# Patient Record
Sex: Female | Born: 1950 | Race: White | Hispanic: No | Marital: Married | Smoking: Former smoker
Health system: Southern US, Community
[De-identification: ages and names within clinical notes are randomized; demographics above are authoritative.]

## PROBLEM LIST (undated history)

## (undated) DIAGNOSIS — Z9889 Other specified postprocedural states: Secondary | ICD-10-CM

## (undated) DIAGNOSIS — M1990 Unspecified osteoarthritis, unspecified site: Secondary | ICD-10-CM

## (undated) DIAGNOSIS — C801 Malignant (primary) neoplasm, unspecified: Secondary | ICD-10-CM

## (undated) DIAGNOSIS — K219 Gastro-esophageal reflux disease without esophagitis: Secondary | ICD-10-CM

## (undated) HISTORY — PX: TONSILLECTOMY: SUR1361

---

## 1996-03-10 HISTORY — PX: HAND SURGERY: SHX662

## 2008-08-14 ENCOUNTER — Ambulatory Visit: Payer: Self-pay | Admitting: Family Medicine

## 2010-02-05 ENCOUNTER — Ambulatory Visit: Payer: Self-pay | Admitting: Family Medicine

## 2014-05-03 ENCOUNTER — Ambulatory Visit: Payer: Self-pay | Admitting: Gastroenterology

## 2014-06-28 ENCOUNTER — Ambulatory Visit
Admit: 2014-06-28 | Disposition: A | Discharge status: Home / Self Care | Payer: Self-pay | Attending: Family Medicine | Admitting: Family Medicine

## 2015-07-16 ENCOUNTER — Ambulatory Visit
Admission: EM | Admit: 2015-07-16 | Discharge: 2015-07-16 | Disposition: A | Discharge status: Home / Self Care | Payer: BLUE CROSS/BLUE SHIELD | Attending: Family Medicine | Admitting: Family Medicine

## 2015-07-16 ENCOUNTER — Encounter: Payer: Self-pay | Admitting: Gynecology

## 2015-07-16 DIAGNOSIS — Z1833 Retained wood fragments: Secondary | ICD-10-CM | POA: Diagnosis not present

## 2015-07-16 HISTORY — DX: Unspecified osteoarthritis, unspecified site: M19.90

## 2015-07-16 HISTORY — DX: Gastro-esophageal reflux disease without esophagitis: K21.9

## 2015-07-16 HISTORY — DX: Other specified postprocedural states: Z98.890

## 2015-07-16 HISTORY — DX: Malignant (primary) neoplasm, unspecified: C80.1

## 2015-07-16 NOTE — ED Provider Notes (Signed)
CSN: [id number]     Arrival date & time 07/16/15  1711 History   First MD Initiated Contact with Patient 07/16/15 1831     Chief Complaint  Patient presents with  . Foreign Body in Skin   (Consider location/radiation/quality/duration/timing/severity/associated sxs/prior Treatment) HPI Comments: 66 yo female with a c/o painful splinter underneath right index fingernail embedded while moving mulch today. States she tried to remove with some tweezers at home but unsuccessful. Patient is otherwise healthy.   The history is provided by the patient.    Past Medical History  Diagnosis Date  . GERD (gastroesophageal reflux disease)   . Arthritis   . Cancer ([place])     skin  . Hx of hand surgery     left hand   Past Surgical History  Procedure Laterality Date  . Tonsillectomy     No family history on file. Social History  Substance Use Topics  . Smoking status: Never Smoker   . Smokeless tobacco: None  . Alcohol Use: Yes   OB History    No data available     Review of Systems  Allergies  Review of patient's allergies indicates no known allergies.  Home Medications   Prior to Admission medications   Not on File   Meds Ordered and Administered this Visit  Medications - No data to display  BP 138/62 mmHg  Pulse 73  Temp(Src) 98.2 F (36.8 C) (Oral)  Resp 16  Ht 5\' 3"  (1.6 m)  Wt 165 lb (74.844 kg)  BMI 29.24 kg/m2  SpO2 98% No data found.   Physical Exam  Constitutional: She appears well-developed and well-nourished. No distress.  Musculoskeletal:       Hands: Right index finger neurovascularly intact with normal range of motion; approximately 4-17mm straight brown color foreign body embedded underneath the fingernail with distal piece slightly extending just under the distal part of the nail  Skin: She is not diaphoretic.  Nursing note and vitals reviewed.   ED Course  Procedures (including critical care time)  Labs Review Labs Reviewed - No data to  display  Imaging Review No results found.   Visual Acuity Review  Right Eye Distance:   Left Eye Distance:   Bilateral Distance:    Right Eye Near:   Left Eye Near:    Bilateral Near:         MDM   1. Embedded wood splinter   (to right index finger)  1. diagnosis reviewed with patient; wood splinter removed in its entirety with splinter forceps; patient tolerated procedure well; finger soaked, irrigated and bandaged. Patient is up to date on her tetanus vaccine. Wound care instructions given. Monitor for signs of infection and follow up as needed.  Norval Gable, MD 07/16/15 1924

## 2015-07-16 NOTE — ED Notes (Signed)
Patient stated x today while moving mulch from wooden fench splinter from wood caught on right index finger

## 2016-12-02 ENCOUNTER — Other Ambulatory Visit: Payer: Self-pay | Admitting: Family Medicine

## 2016-12-02 DIAGNOSIS — R109 Unspecified abdominal pain: Secondary | ICD-10-CM

## 2016-12-02 DIAGNOSIS — R3129 Other microscopic hematuria: Secondary | ICD-10-CM

## 2016-12-15 ENCOUNTER — Ambulatory Visit: Payer: BLUE CROSS/BLUE SHIELD

## 2017-05-28 ENCOUNTER — Other Ambulatory Visit: Payer: Self-pay | Admitting: Family Medicine

## 2017-05-28 DIAGNOSIS — M8580 Other specified disorders of bone density and structure, unspecified site: Secondary | ICD-10-CM

## 2017-07-22 ENCOUNTER — Other Ambulatory Visit: Payer: Self-pay | Admitting: Family Medicine

## 2017-07-22 DIAGNOSIS — Z1231 Encounter for screening mammogram for malignant neoplasm of breast: Secondary | ICD-10-CM

## 2017-07-27 ENCOUNTER — Ambulatory Visit
Admission: RE | Admit: 2017-07-27 | Discharge: 2017-07-27 | Disposition: A | Discharge status: Home / Self Care | Payer: BLUE CROSS/BLUE SHIELD | Source: Ambulatory Visit | Attending: Family Medicine | Admitting: Family Medicine

## 2017-07-27 ENCOUNTER — Encounter (INDEPENDENT_AMBULATORY_CARE_PROVIDER_SITE_OTHER): Payer: Self-pay

## 2017-07-27 DIAGNOSIS — M8580 Other specified disorders of bone density and structure, unspecified site: Secondary | ICD-10-CM | POA: Insufficient documentation

## 2017-07-27 DIAGNOSIS — Z1231 Encounter for screening mammogram for malignant neoplasm of breast: Secondary | ICD-10-CM | POA: Insufficient documentation

## 2018-07-28 ENCOUNTER — Other Ambulatory Visit: Payer: Self-pay | Admitting: Gastroenterology

## 2018-07-28 DIAGNOSIS — R1084 Generalized abdominal pain: Secondary | ICD-10-CM

## 2018-08-05 ENCOUNTER — Other Ambulatory Visit: Payer: Self-pay

## 2018-08-05 ENCOUNTER — Ambulatory Visit
Admission: RE | Admit: 2018-08-05 | Discharge: 2018-08-05 | Disposition: A | Discharge status: Home / Self Care | Payer: Medicare Other | Source: Ambulatory Visit | Attending: Gastroenterology | Admitting: Gastroenterology

## 2018-08-05 DIAGNOSIS — R1084 Generalized abdominal pain: Secondary | ICD-10-CM | POA: Diagnosis present

## 2018-08-05 MED ORDER — IOHEXOL 300 MG/ML  SOLN
100.0000 mL | Freq: Once | INTRAMUSCULAR | Status: AC | PRN
  Administered 2018-08-05: 10:00:00 100 mL via INTRAVENOUS

## 2018-08-10 ENCOUNTER — Other Ambulatory Visit
Admission: RE | Admit: 2018-08-10 | Discharge: 2018-08-10 | Disposition: A | Discharge status: Home / Self Care | Payer: Medicare Other | Source: Ambulatory Visit | Attending: Gastroenterology | Admitting: Gastroenterology

## 2018-08-10 ENCOUNTER — Other Ambulatory Visit: Payer: Self-pay

## 2018-08-10 DIAGNOSIS — Z1159 Encounter for screening for other viral diseases: Secondary | ICD-10-CM | POA: Diagnosis present

## 2018-08-11 LAB — NOVEL CORONAVIRUS, NAA (HOSP ORDER, SEND-OUT TO REF LAB; TAT 18-24 HRS): SARS-CoV-2, NAA: NOT DETECTED

## 2018-08-13 ENCOUNTER — Ambulatory Visit: Payer: Medicare Other | Admitting: Certified Registered Nurse Anesthetist

## 2018-08-13 ENCOUNTER — Encounter: Payer: Self-pay | Admitting: *Deleted

## 2018-08-13 ENCOUNTER — Encounter
Admission: RE | Disposition: A | Discharge status: Home / Self Care | Payer: Self-pay | Source: Home / Self Care | Attending: Gastroenterology

## 2018-08-13 ENCOUNTER — Ambulatory Visit
Admission: RE | Admit: 2018-08-13 | Discharge: 2018-08-13 | Disposition: A | Discharge status: Home / Self Care | Payer: Medicare Other | Attending: Gastroenterology | Admitting: Gastroenterology

## 2018-08-13 ENCOUNTER — Other Ambulatory Visit: Payer: Self-pay

## 2018-08-13 DIAGNOSIS — K219 Gastro-esophageal reflux disease without esophagitis: Secondary | ICD-10-CM | POA: Diagnosis present

## 2018-08-13 DIAGNOSIS — K2970 Gastritis, unspecified, without bleeding: Secondary | ICD-10-CM | POA: Insufficient documentation

## 2018-08-13 DIAGNOSIS — Z6830 Body mass index (BMI) 30.0-30.9, adult: Secondary | ICD-10-CM | POA: Insufficient documentation

## 2018-08-13 DIAGNOSIS — E669 Obesity, unspecified: Secondary | ICD-10-CM | POA: Insufficient documentation

## 2018-08-13 DIAGNOSIS — M1990 Unspecified osteoarthritis, unspecified site: Secondary | ICD-10-CM | POA: Insufficient documentation

## 2018-08-13 HISTORY — PX: ESOPHAGOGASTRODUODENOSCOPY (EGD) WITH PROPOFOL: SHX5813

## 2018-08-13 SURGERY — ESOPHAGOGASTRODUODENOSCOPY (EGD) WITH PROPOFOL
Anesthesia: General

## 2018-08-13 MED ORDER — SODIUM CHLORIDE 0.9 % IV SOLN
INTRAVENOUS | Status: DC
  Administered 2018-08-13: 13:00:00 via INTRAVENOUS

## 2018-08-13 MED ORDER — PROPOFOL 10 MG/ML IV BOLUS
INTRAVENOUS | Status: DC | PRN
  Administered 2018-08-13: 40 mg via INTRAVENOUS

## 2018-08-13 MED ORDER — LIDOCAINE HCL (PF) 1 % IJ SOLN
INTRAMUSCULAR | Status: AC
  Filled 2018-08-13: qty 2

## 2018-08-13 MED ORDER — PROPOFOL 500 MG/50ML IV EMUL
INTRAVENOUS | Status: DC | PRN
  Administered 2018-08-13: 100 ug/kg/min via INTRAVENOUS

## 2018-08-13 MED ORDER — LIDOCAINE HCL (CARDIAC) PF 100 MG/5ML IV SOSY
PREFILLED_SYRINGE | INTRAVENOUS | Status: DC | PRN
  Administered 2018-08-13: 50 mg via INTRAVENOUS

## 2018-08-13 MED ORDER — SODIUM CHLORIDE 0.9 % IV SOLN
INTRAVENOUS | Status: DC
  Administered 2018-08-13: 1000 mL via INTRAVENOUS

## 2018-08-13 NOTE — Anesthesia Postprocedure Evaluation (Signed)
Anesthesia Post Note  Patient: Gloria Gaines [facility]  Procedure(s) Performed: ESOPHAGOGASTRODUODENOSCOPY (EGD) WITH PROPOFOL (N/A )  Patient location during evaluation: PACU Anesthesia Type: General Level of consciousness: awake and alert and oriented Pain management: pain level controlled Vital Signs Assessment: post-procedure vital signs reviewed and stable Respiratory status: spontaneous breathing, nonlabored ventilation and respiratory function stable Cardiovascular status: blood pressure returned to baseline and stable Postop Assessment: no signs of nausea or vomiting Anesthetic complications: no     Last Vitals:  Vitals:   08/13/18 1338 08/13/18 1348  BP: (!) 118/58 128/69  Pulse: 73 69  Resp: 20 19  Temp: (!) 36.1 C   SpO2:      Last Pain:  Vitals:   08/13/18 1348  TempSrc:   PainSc: 0-No pain                 Deatra Mcmahen

## 2018-08-13 NOTE — H&P (Signed)
Outpatient short stay form Pre-procedure 08/13/2018 1:15 PM Lollie Sails MD  Primary Physician: Gayland Curry, MD  Reason for visit: EGD  History of present illness: Patient is a 68 year old female presenting today for an EGD.  Has a history of some dyspepsia and weight loss.  She was having some epigastric pain and nausea.  She was started on a proton pump inhibitor, Protonix and Carafate.  Says improved all of her symptoms.  She has had no dysphagia although previously did have some heartburn symptoms.  She did have a CT scan because of the abdominal pain that was found to have a thickened region at the bottom of the esophagus.  Presented today for an EGD.  Was taking ibuprofen regularly before her symptoms developed and has subsequently stopped that.  She was taking it right before going to bed and is possible that she had some pill esophagitis as well. She takes no other agents such as aspirin or blood thinning agents.  Further NSAID use.   Current Facility-Administered Medications:  .  0.9 %  sodium chloride infusion, , Intravenous, Continuous, Lollie Sails, MD .  0.9 %  sodium chloride infusion, , Intravenous, Continuous, Lollie Sails, MD, Last Rate: 20 mL/hr at 08/13/18 1308, 1,000 mL at 08/13/18 1308 .  lidocaine (PF) (XYLOCAINE) 1 % injection, , , ,   Medications Prior to Admission  Medication Sig Dispense Refill Last Dose  . pantoprazole (PROTONIX) 20 MG tablet Take 20 mg by mouth daily.   08/12/2018 at Unknown time  . sucralfate (CARAFATE) 1 g tablet Take 1 g by mouth 4 (four) times daily -  with meals and at bedtime.   08/12/2018 at Unknown time  . dicyclomine (BENTYL) 10 MG capsule Take 10 mg by mouth 4 (four) times daily -  before meals and at bedtime.   Not Taking at Unknown time  . ondansetron (ZOFRAN) 4 MG tablet Take 4 mg by mouth every 8 (eight) hours as needed for nausea or vomiting.   Not Taking at Unknown time     No Known Allergies   Past Medical  History:  Diagnosis Date  . Arthritis   . Cancer ([place])    skin  . GERD (gastroesophageal reflux disease)   . Hx of hand surgery    left hand    Review of systems:      Physical Exam    Heart and lungs: Rhythm without rub or gallop lungs are bilaterally clear    HEENT: Normocephalic atraumatic eyes are anicteric    Other:    Pertinant exam for procedure: Soft nontender nondistended bowel sounds positive normoactive    Planned proceedures: EGD and indicated procedures. I have discussed the risks benefits and complications of procedures to include not limited to bleeding, infection, perforation and the risk of sedation and the patient wishes to proceed.    Lollie Sails, MD Gastroenterology 08/13/2018  1:15 PM

## 2018-08-13 NOTE — Op Note (Signed)
[facility] Gastroenterology Patient Name: Gloria Gaines Procedure Date: 08/13/2018 12:41 PM MRN: [id number] Account #: [id number] Date of Birth: Aug 23, 1950 Admit Type: Outpatient Age: 68 Room: [facility] ENDO ROOM 4 Gender: Female Note Status: Finalized Procedure:            Upper GI endoscopy Indications:          Dyspepsia, Abnormal CT of the GI tract Providers:            Lollie Sails, MD Referring MD:         Gayland Curry MD, MD (Referring MD) Medicines:            Monitored Anesthesia Care Complications:        No immediate complications. Procedure:            Pre-Anesthesia Assessment:                       - ASA Grade Assessment: II - A patient with mild                        systemic disease.                       After obtaining informed consent, the endoscope was                        passed under direct vision. Throughout the procedure,                        the patient's blood pressure, pulse, and oxygen                        saturations were monitored continuously. The Endoscope                        was introduced through the mouth, and advanced to the                        third part of duodenum. The upper GI endoscopy was                        accomplished without difficulty. The patient tolerated                        the procedure well. Findings:      The Z-line was variable. Biopsies were taken with a cold forceps for       histology.      The distal esophagus is otherwise normal.      Diffuse and patchy minimal inflammation characterized by congestion       (edema) was found in the gastric body. Biopsies were taken with a cold       forceps for histology. Biopsies were taken with a cold forceps for       histology.      The examined duodenum was normal.      The cardia and gastric fundus were normal on retroflexion.      The aretynoid prominances are noted to be mildly erythematous. Impression:           - Z-line variable.  Biopsied.                       -  Gastritis. Biopsied.                       - Normal examined duodenum. Recommendation:       - Discharge patient to home.                       - Use Protonix (pantoprazole) 40 mg PO daily daily. Procedure Code(s):    --- Professional ---                       [phone], Esophagogastroduodenoscopy, flexible, transoral;                        with biopsy, single or multiple Diagnosis Code(s):    --- Professional ---                       K22.8, Other specified diseases of esophagus                       K29.70, Gastritis, unspecified, without bleeding                       R10.13, Epigastric pain                       R93.3, Abnormal findings on diagnostic imaging of other                        parts of digestive tract CPT copyright 2019 American Medical Association. All rights reserved. The codes documented in this report are preliminary and upon coder review may  be revised to meet current compliance requirements. Lollie Sails, MD 08/13/2018 1:41:14 PM This report has been signed electronically. Number of Addenda: 0 Note Initiated On: 08/13/2018 12:41 PM      [facility]

## 2018-08-13 NOTE — Transfer of Care (Signed)
Immediate Anesthesia Transfer of Care Note  Patient: Gloria Gaines [facility]  Procedure(s) Performed: ESOPHAGOGASTRODUODENOSCOPY (EGD) WITH PROPOFOL (N/A )  Patient Location: PACU  Anesthesia Type:MAC  Level of Consciousness: awake, alert  and oriented  Airway & Oxygen Therapy: Patient Spontanous Breathing  Post-op Assessment: Report given to RN and Post -op Vital signs reviewed and stable  Post vital signs: Reviewed and stable  Last Vitals:  Vitals Value Taken Time  BP    Temp    Pulse    Resp    SpO2      Last Pain:  Vitals:   08/13/18 1231  TempSrc: Tympanic  PainSc: 0-No pain         Complications: No apparent anesthesia complications

## 2018-08-13 NOTE — Anesthesia Preprocedure Evaluation (Signed)
Anesthesia Evaluation  Patient identified by MRN, date of birth, ID band Patient awake    Reviewed: Allergy & Precautions, NPO status , Patient's Chart, lab work & pertinent test results  History of Anesthesia Complications Negative for: history of anesthetic complications  Airway Mallampati: II  TM Distance: >3 FB Neck ROM: Full    Dental no notable dental hx.    Pulmonary neg pulmonary ROS, neg sleep apnea, neg COPD,    breath sounds clear to auscultation- rhonchi (-) wheezing      Cardiovascular Exercise Tolerance: Good (-) hypertension(-) CAD, (-) Past MI, (-) Cardiac Stents and (-) CABG  Rhythm:Regular Rate:Normal - Systolic murmurs and - Diastolic murmurs    Neuro/Psych neg Seizures negative neurological ROS  negative psych ROS   GI/Hepatic Neg liver ROS, GERD  ,  Endo/Other  negative endocrine ROSneg diabetes  Renal/GU negative Renal ROS     Musculoskeletal  (+) Arthritis ,   Abdominal (+) + obese,   Peds  Hematology negative hematology ROS (+)   Anesthesia Other Findings Past Medical History: No date: Arthritis No date: Cancer ([place])     Comment:  skin No date: GERD (gastroesophageal reflux disease) No date: Hx of hand surgery     Comment:  left hand   Reproductive/Obstetrics                             Anesthesia Physical Anesthesia Plan  ASA: II  Anesthesia Plan: General   Post-op Pain Management:    Induction: Intravenous  PONV Risk Score and Plan: 2 and Propofol infusion  Airway Management Planned: Natural Airway  Additional Equipment:   Intra-op Plan:   Post-operative Plan:   Informed Consent: I have reviewed the patients History and Physical, chart, labs and discussed the procedure including the risks, benefits and alternatives for the proposed anesthesia with the patient or authorized representative who has indicated his/her understanding and acceptance.      Dental advisory given  Plan Discussed with: CRNA and Anesthesiologist  Anesthesia Plan Comments:         Anesthesia Quick Evaluation

## 2018-08-13 NOTE — Anesthesia Post-op Follow-up Note (Signed)
Anesthesia QCDR form completed.        

## 2018-08-17 LAB — SURGICAL PATHOLOGY

## 2019-10-20 ENCOUNTER — Other Ambulatory Visit: Payer: Self-pay | Admitting: Family Medicine

## 2019-10-20 DIAGNOSIS — M85859 Other specified disorders of bone density and structure, unspecified thigh: Secondary | ICD-10-CM

## 2019-10-20 DIAGNOSIS — Z1231 Encounter for screening mammogram for malignant neoplasm of breast: Secondary | ICD-10-CM

## 2019-10-20 DIAGNOSIS — M85852 Other specified disorders of bone density and structure, left thigh: Secondary | ICD-10-CM

## 2019-11-08 ENCOUNTER — Other Ambulatory Visit: Payer: Self-pay

## 2019-11-08 ENCOUNTER — Ambulatory Visit
Admission: RE | Admit: 2019-11-08 | Discharge: 2019-11-08 | Disposition: A | Discharge status: Home / Self Care | Payer: Medicare Other | Source: Ambulatory Visit | Attending: Family Medicine | Admitting: Family Medicine

## 2019-11-08 DIAGNOSIS — Z780 Asymptomatic menopausal state: Secondary | ICD-10-CM | POA: Insufficient documentation

## 2019-11-08 DIAGNOSIS — Z13820 Encounter for screening for osteoporosis: Secondary | ICD-10-CM | POA: Diagnosis not present

## 2019-11-08 DIAGNOSIS — M85852 Other specified disorders of bone density and structure, left thigh: Secondary | ICD-10-CM | POA: Diagnosis not present

## 2019-11-08 DIAGNOSIS — Z1231 Encounter for screening mammogram for malignant neoplasm of breast: Secondary | ICD-10-CM | POA: Insufficient documentation

## 2019-11-08 DIAGNOSIS — M85859 Other specified disorders of bone density and structure, unspecified thigh: Secondary | ICD-10-CM

## 2019-11-11 ENCOUNTER — Other Ambulatory Visit: Payer: Self-pay | Admitting: Family Medicine

## 2019-11-16 ENCOUNTER — Other Ambulatory Visit: Payer: Self-pay | Admitting: Family Medicine

## 2019-11-16 DIAGNOSIS — N6489 Other specified disorders of breast: Secondary | ICD-10-CM

## 2019-11-16 DIAGNOSIS — R928 Other abnormal and inconclusive findings on diagnostic imaging of breast: Secondary | ICD-10-CM

## 2019-11-30 ENCOUNTER — Other Ambulatory Visit: Payer: Self-pay | Admitting: Family Medicine

## 2019-11-30 ENCOUNTER — Other Ambulatory Visit: Payer: Self-pay

## 2019-11-30 ENCOUNTER — Ambulatory Visit
Admission: RE | Admit: 2019-11-30 | Discharge: 2019-11-30 | Disposition: A | Discharge status: Home / Self Care | Payer: Medicare Other | Source: Ambulatory Visit | Attending: Family Medicine | Admitting: Family Medicine

## 2019-11-30 DIAGNOSIS — N6489 Other specified disorders of breast: Secondary | ICD-10-CM | POA: Diagnosis present

## 2019-11-30 DIAGNOSIS — R928 Other abnormal and inconclusive findings on diagnostic imaging of breast: Secondary | ICD-10-CM

## 2020-01-20 ENCOUNTER — Other Ambulatory Visit: Payer: Self-pay | Admitting: Family Medicine

## 2020-01-20 DIAGNOSIS — R928 Other abnormal and inconclusive findings on diagnostic imaging of breast: Secondary | ICD-10-CM

## 2020-03-29 ENCOUNTER — Other Ambulatory Visit: Payer: Self-pay | Admitting: Family Medicine

## 2020-03-29 DIAGNOSIS — R928 Other abnormal and inconclusive findings on diagnostic imaging of breast: Secondary | ICD-10-CM

## 2020-05-16 ENCOUNTER — Other Ambulatory Visit: Payer: Medicare Other

## 2020-05-17 ENCOUNTER — Ambulatory Visit
Admission: RE | Admit: 2020-05-17 | Discharge: 2020-05-17 | Disposition: A | Discharge status: Home / Self Care | Payer: Medicare Other | Source: Ambulatory Visit | Attending: Family Medicine | Admitting: Family Medicine

## 2020-05-17 ENCOUNTER — Other Ambulatory Visit: Payer: Self-pay

## 2020-05-17 DIAGNOSIS — R928 Other abnormal and inconclusive findings on diagnostic imaging of breast: Secondary | ICD-10-CM | POA: Diagnosis not present

## 2021-11-20 ENCOUNTER — Other Ambulatory Visit: Payer: Self-pay | Admitting: Family Medicine

## 2021-11-20 DIAGNOSIS — Z780 Asymptomatic menopausal state: Secondary | ICD-10-CM

## 2021-11-20 DIAGNOSIS — Z1231 Encounter for screening mammogram for malignant neoplasm of breast: Secondary | ICD-10-CM

## 2021-11-20 DIAGNOSIS — M85851 Other specified disorders of bone density and structure, right thigh: Secondary | ICD-10-CM

## 2021-12-10 ENCOUNTER — Ambulatory Visit
Admission: RE | Admit: 2021-12-10 | Discharge: 2021-12-10 | Disposition: A | Discharge status: Home / Self Care | Payer: Medicare Other | Source: Ambulatory Visit | Attending: Family Medicine | Admitting: Family Medicine

## 2021-12-10 DIAGNOSIS — Z780 Asymptomatic menopausal state: Secondary | ICD-10-CM | POA: Insufficient documentation

## 2021-12-10 DIAGNOSIS — Z1231 Encounter for screening mammogram for malignant neoplasm of breast: Secondary | ICD-10-CM | POA: Insufficient documentation

## 2021-12-10 DIAGNOSIS — M85851 Other specified disorders of bone density and structure, right thigh: Secondary | ICD-10-CM | POA: Insufficient documentation

## 2021-12-17 ENCOUNTER — Other Ambulatory Visit: Payer: Self-pay | Admitting: Family Medicine

## 2021-12-17 DIAGNOSIS — R928 Other abnormal and inconclusive findings on diagnostic imaging of breast: Secondary | ICD-10-CM

## 2021-12-17 DIAGNOSIS — N6489 Other specified disorders of breast: Secondary | ICD-10-CM

## 2022-01-07 ENCOUNTER — Ambulatory Visit
Admission: RE | Admit: 2022-01-07 | Discharge: 2022-01-07 | Disposition: A | Discharge status: Home / Self Care | Payer: Medicare Other | Source: Ambulatory Visit | Attending: Family Medicine | Admitting: Family Medicine

## 2022-01-07 DIAGNOSIS — N6489 Other specified disorders of breast: Secondary | ICD-10-CM | POA: Insufficient documentation

## 2022-01-07 DIAGNOSIS — R928 Other abnormal and inconclusive findings on diagnostic imaging of breast: Secondary | ICD-10-CM | POA: Insufficient documentation

## 2022-02-27 IMAGING — MG DIGITAL SCREENING BILAT W/ TOMO W/ CAD
8 series · 8 of 24 positions shown · non-contrast
Comparison: Previous exam(s).
COMPARISON: Previous exam(s).

Addendum:
CLINICAL DATA: Screening.

EXAM:
DIGITAL SCREENING BILATERAL MAMMOGRAM WITH TOMO AND CAD

[L CC synth-2D]
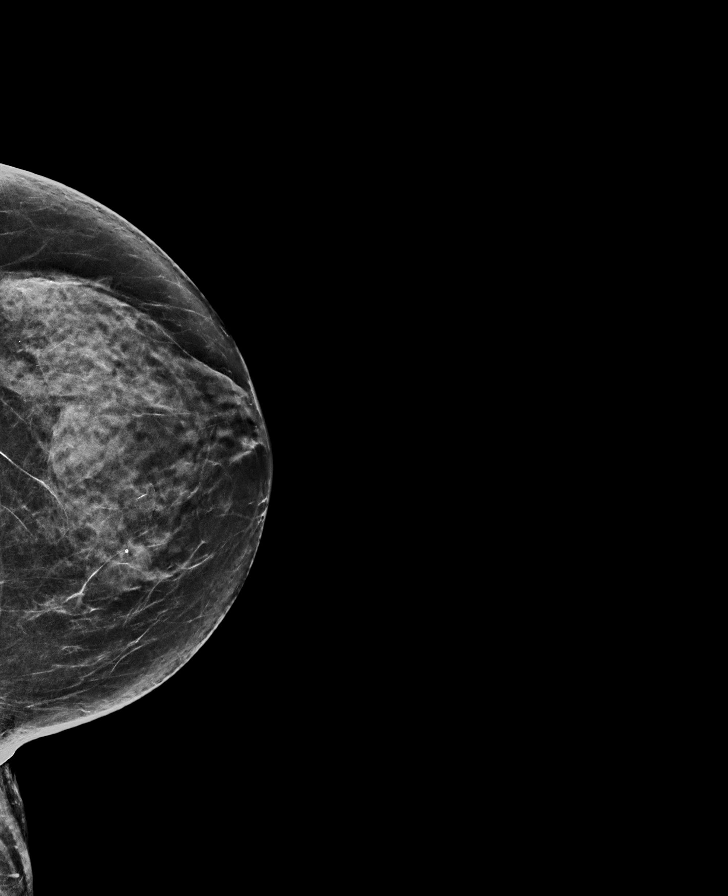

[L MLO synth-2D]
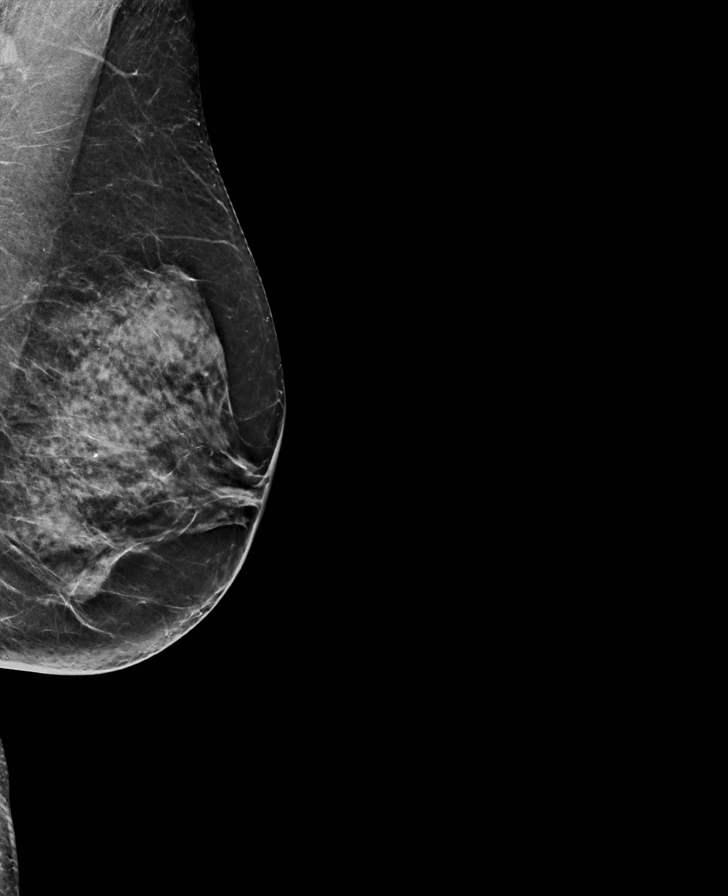

[R MLO synth-2D]
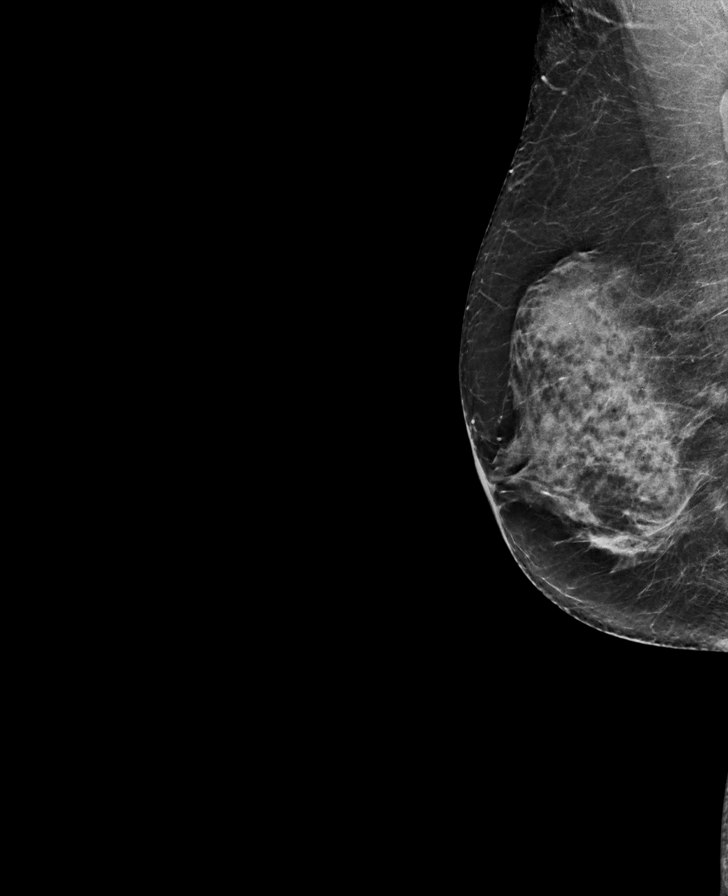

[R CC synth-2D]
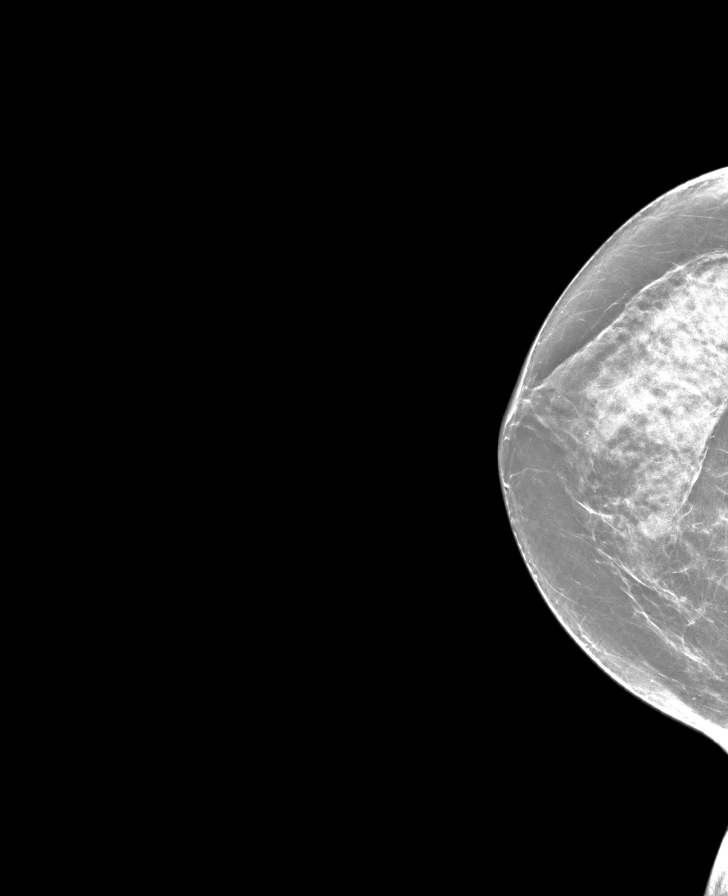

[L MLO tomo · tomo slice 33/65.0]
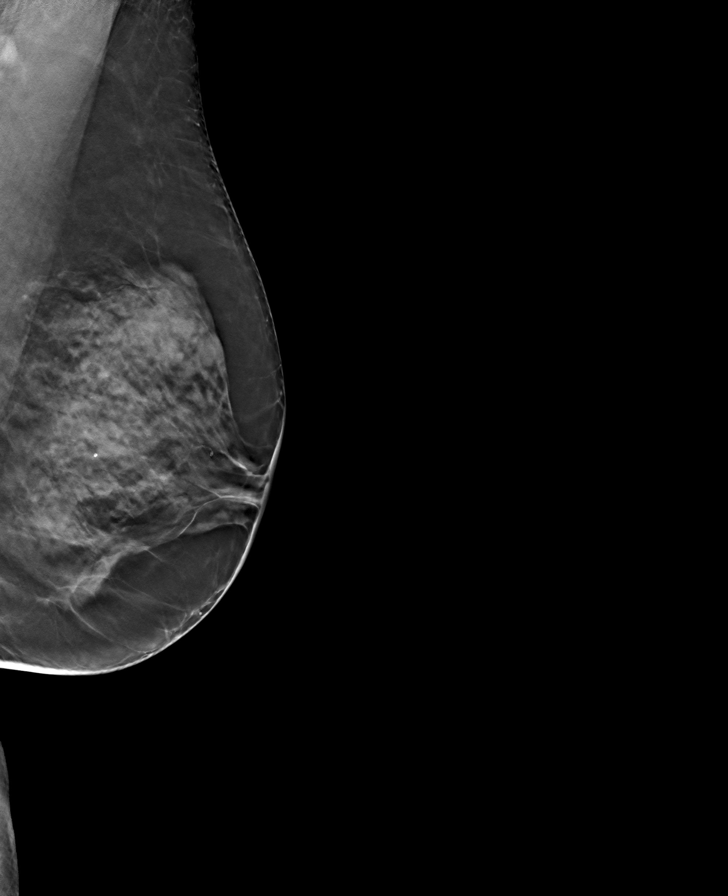

[L CC tomo · tomo slice 33/66.0]
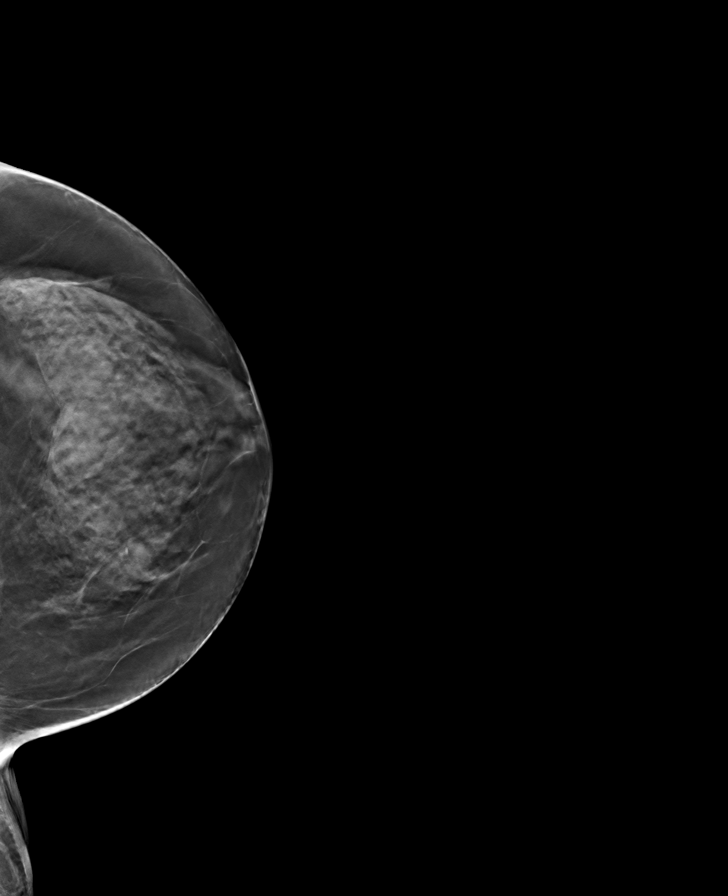

[R CC tomo · tomo slice 33/65.0]
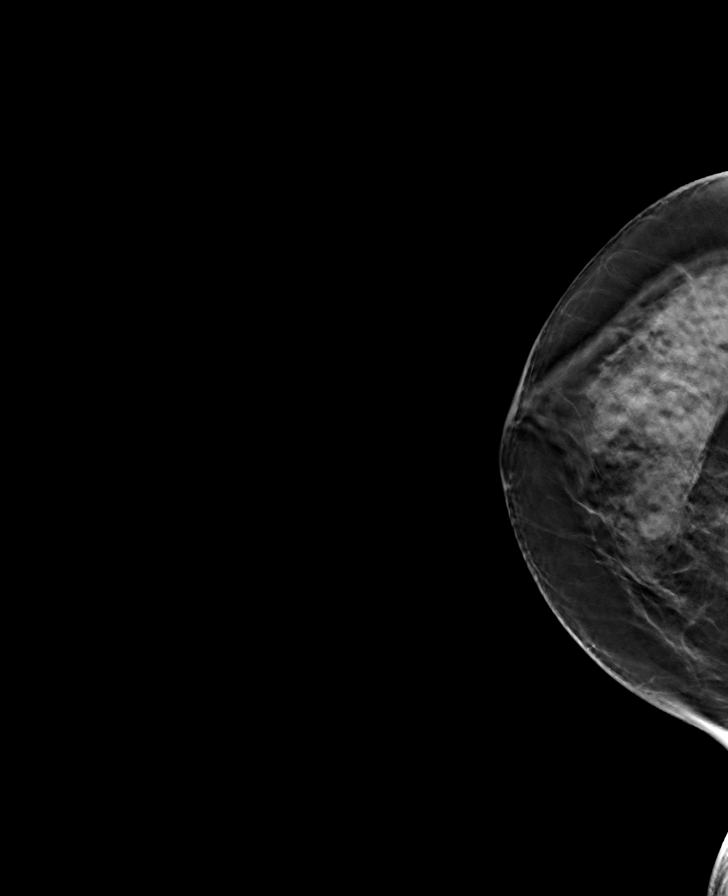

[R MLO tomo · tomo slice 35/68.0]
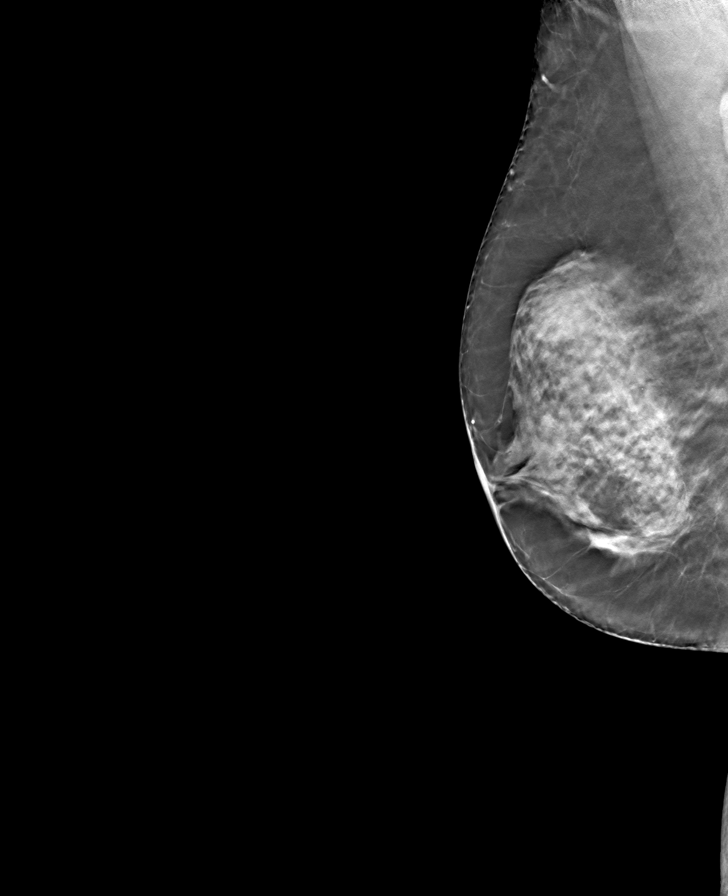

[8 of 24 positions shown; findings below may reference images not displayed]

ACR Breast Density Category d: The breast tissue is extremely dense,
which lowers the sensitivity of mammography.
FINDINGS: In the left breast, a possible asymmetry warrants further
evaluation. In the right breast, no findings suspicious for
malignancy. Images were processed with CAD.
IMPRESSION: Further evaluation is suggested for possible asymmetry in the left
breast.

RECOMMENDATION:
Diagnostic mammogram and possibly ultrasound of the left breast.
(Code:JN-P-XX3)

The patient will be contacted regarding the findings, and additional
imaging will be scheduled.

BI-RADS CATEGORY  0: Incomplete. Need additional imaging evaluation
and/or prior mammograms for comparison.

ADDENDUM:
Please note that the asymmetry is in the RIGHT breast.

*** End of Addendum ***
ACR Breast Density Category d: The breast tissue is extremely dense,
which lowers the sensitivity of mammography.
FINDINGS: In the left breast, a possible asymmetry warrants further
evaluation. In the right breast, no findings suspicious for
malignancy. Images were processed with CAD.
IMPRESSION: Further evaluation is suggested for possible asymmetry in the left
breast.

RECOMMENDATION:
Diagnostic mammogram and possibly ultrasound of the left breast.
(Code:JN-P-XX3)

The patient will be contacted regarding the findings, and additional
imaging will be scheduled.

BI-RADS CATEGORY  0: Incomplete. Need additional imaging evaluation
and/or prior mammograms for comparison.

## 2022-03-05 ENCOUNTER — Encounter: Payer: Self-pay | Admitting: Ophthalmology

## 2022-03-13 NOTE — Discharge Instructions (Signed)

## 2022-03-17 ENCOUNTER — Other Ambulatory Visit: Payer: Self-pay

## 2022-03-17 ENCOUNTER — Ambulatory Visit: Payer: Medicare Other | Admitting: Anesthesiology

## 2022-03-17 ENCOUNTER — Ambulatory Visit
Admission: RE | Admit: 2022-03-17 | Discharge: 2022-03-17 | Disposition: A | Discharge status: Home / Self Care | Payer: Medicare Other | Attending: Ophthalmology | Admitting: Ophthalmology

## 2022-03-17 ENCOUNTER — Encounter
Admission: RE | Disposition: A | Discharge status: Home / Self Care | Payer: Self-pay | Source: Home / Self Care | Attending: Ophthalmology

## 2022-03-17 DIAGNOSIS — I10 Essential (primary) hypertension: Secondary | ICD-10-CM | POA: Diagnosis not present

## 2022-03-17 DIAGNOSIS — K219 Gastro-esophageal reflux disease without esophagitis: Secondary | ICD-10-CM | POA: Diagnosis not present

## 2022-03-17 DIAGNOSIS — H2511 Age-related nuclear cataract, right eye: Secondary | ICD-10-CM | POA: Diagnosis present

## 2022-03-17 DIAGNOSIS — Z87891 Personal history of nicotine dependence: Secondary | ICD-10-CM | POA: Insufficient documentation

## 2022-03-17 DIAGNOSIS — E1136 Type 2 diabetes mellitus with diabetic cataract: Secondary | ICD-10-CM | POA: Diagnosis not present

## 2022-03-17 HISTORY — DX: Other specified postprocedural states: Z98.890

## 2022-03-17 HISTORY — PX: CATARACT EXTRACTION W/PHACO: SHX586

## 2022-03-17 SURGERY — PHACOEMULSIFICATION, CATARACT, WITH IOL INSERTION
Anesthesia: Monitor Anesthesia Care | Site: Eye | Laterality: Right

## 2022-03-17 MED ORDER — MIDAZOLAM HCL 2 MG/2ML IJ SOLN
INTRAMUSCULAR | Status: DC | PRN
  Administered 2022-03-17: 2 mg via INTRAVENOUS

## 2022-03-17 MED ORDER — MOXIFLOXACIN HCL 0.5 % OP SOLN
OPHTHALMIC | Status: DC | PRN
  Administered 2022-03-17: .2 mL via OPHTHALMIC

## 2022-03-17 MED ORDER — ONDANSETRON HCL 4 MG/2ML IJ SOLN
4.0000 mg | Freq: Once | INTRAMUSCULAR | Status: AC
  Administered 2022-03-17: 4 mg via INTRAVENOUS

## 2022-03-17 MED ORDER — SIGHTPATH DOSE#1 BSS IO SOLN
INTRAOCULAR | Status: DC | PRN
  Administered 2022-03-17: 84 mL via OPHTHALMIC

## 2022-03-17 MED ORDER — ONDANSETRON HCL 4 MG/2ML IJ SOLN: 4.0000 mg | Freq: Once | INTRAMUSCULAR | Status: DC | PRN

## 2022-03-17 MED ORDER — TETRACAINE HCL 0.5 % OP SOLN
1.0000 [drp] | OPHTHALMIC | Status: DC | PRN
  Administered 2022-03-17 (×3): 1 [drp] via OPHTHALMIC

## 2022-03-17 MED ORDER — SIGHTPATH DOSE#1 NA HYALUR & NA CHOND-NA HYALUR IO KIT
PACK | INTRAOCULAR | Status: DC | PRN
  Administered 2022-03-17: 1 via OPHTHALMIC

## 2022-03-17 MED ORDER — LIDOCAINE HCL (PF) 2 % IJ SOLN
INTRAOCULAR | Status: DC | PRN
  Administered 2022-03-17: 1 mL via INTRAOCULAR

## 2022-03-17 MED ORDER — LACTATED RINGERS IV SOLN: INTRAVENOUS | Status: DC

## 2022-03-17 MED ORDER — ARMC OPHTHALMIC DILATING DROPS
1.0000 | OPHTHALMIC | Status: DC | PRN
  Administered 2022-03-17 (×3): 1 via OPHTHALMIC

## 2022-03-17 MED ORDER — FENTANYL CITRATE (PF) 100 MCG/2ML IJ SOLN
INTRAMUSCULAR | Status: DC | PRN
  Administered 2022-03-17 (×2): 50 ug via INTRAVENOUS

## 2022-03-17 MED ORDER — FENTANYL CITRATE PF 50 MCG/ML IJ SOSY: 25.0000 ug | PREFILLED_SYRINGE | INTRAMUSCULAR | Status: DC | PRN

## 2022-03-17 MED ORDER — SIGHTPATH DOSE#1 BSS IO SOLN
INTRAOCULAR | Status: DC | PRN
  Administered 2022-03-17: 15 mL

## 2022-03-17 SURGICAL SUPPLY — 14 items
CATARACT SUITE SIGHTPATH (MISCELLANEOUS) ×1 IMPLANT
DISSECTOR HYDRO NUCLEUS 50X22 (MISCELLANEOUS) ×1 IMPLANT
FEE CATARACT SUITE SIGHTPATH (MISCELLANEOUS) ×1 IMPLANT
GLOVE SURG GAMMEX PI TX LF 7.5 (GLOVE) ×1 IMPLANT
GLOVE SURG SYN 8.5  E (GLOVE) ×1
GLOVE SURG SYN 8.5 E (GLOVE) ×1 IMPLANT
GLOVE SURG SYN 8.5 PF PI (GLOVE) ×1 IMPLANT
LENS IOL EYHANCE TORIC II 8.5 ×1 IMPLANT
LENS IOL EYHANCE TRC 225 8.5 IMPLANT
NDL FILTER BLUNT 18X1 1/2 (NEEDLE) ×1 IMPLANT
NEEDLE FILTER BLUNT 18X1 1/2 (NEEDLE) ×1 IMPLANT
SYR 3ML LL SCALE MARK (SYRINGE) ×1 IMPLANT
SYR 5ML LL (SYRINGE) ×1 IMPLANT
WATER STERILE IRR 250ML POUR (IV SOLUTION) ×1 IMPLANT

## 2022-03-17 NOTE — Anesthesia Postprocedure Evaluation (Signed)
Anesthesia Post Note  Patient: Gloria Gaines [facility]  Procedure(s) Performed: CATARACT EXTRACTION PHACO AND INTRAOCULAR LENS PLACEMENT (IOC) RIGHT eyhance TORIC  2.95  00:27.4 (Right: Eye)  Patient location during evaluation: PACU Anesthesia Type: MAC Level of consciousness: awake and alert Pain management: pain level controlled Vital Signs Assessment: post-procedure vital signs reviewed and stable Respiratory status: spontaneous breathing, nonlabored ventilation, respiratory function stable and patient connected to nasal cannula oxygen Cardiovascular status: stable and blood pressure returned to baseline Postop Assessment: no apparent nausea or vomiting Anesthetic complications: no   No notable events documented.   Last Vitals:  Vitals:   03/17/22 1251 03/17/22 1259  BP: (!) 152/90 131/76  Pulse: 75 71  Resp: 18 14  Temp: (!) 36.3 C (!) 36.3 C  SpO2: 99% 97%    Last Pain:  Vitals:   03/17/22 1259  TempSrc:   PainSc: 0-No pain                 Molli Barrows

## 2022-03-17 NOTE — H&P (Signed)
[facility]   Primary Care Physician:  Gayland Curry, MD Ophthalmologist: Dr. Benay Pillow  Pre-Procedure History & Physical: HPI:  Gloria Gaines is a 72 y.o. female here for cataract surgery.   Past Medical History:  Diagnosis Date   Arthritis    Cancer ([place])    skin   GERD (gastroesophageal reflux disease)    Hx of hand surgery    left hand   PONV (postoperative nausea and vomiting)     Past Surgical History:  Procedure Laterality Date   ESOPHAGOGASTRODUODENOSCOPY (EGD) WITH PROPOFOL N/A 08/13/2018   Procedure: ESOPHAGOGASTRODUODENOSCOPY (EGD) WITH PROPOFOL;  Surgeon: Lollie Sails, MD;  Location: [facility] ENDOSCOPY;  Service: Endoscopy;  Laterality: N/A;   HAND SURGERY  1998   TONSILLECTOMY      Prior to Admission medications   Medication Sig Start Date End Date Taking? Authorizing Provider  atorvastatin (LIPITOR) 10 MG tablet Take 10 mg by mouth daily.   Yes [provider]  Multiple Vitamin (MULTIVITAMIN) tablet Take 1 tablet by mouth daily.   Yes [provider]    Allergies as of 01/16/2022   (No Known Allergies)    Family History  Problem Relation Age of Onset   Breast cancer Neg Hx     Social History   Socioeconomic History   Marital status: Married    Spouse name: Not on file   Number of children: Not on file   Years of education: Not on file   Highest education level: Not on file  Occupational History   Not on file  Tobacco Use   Smoking status: Former    Types: Cigarettes    Quit date: 63    Years since quitting: 37.0   Smokeless tobacco: Never   Tobacco comments:    socially  Media planner   Vaping Use: Never used  Substance and Sexual Activity   Alcohol use: Yes    Alcohol/week: 4.0 standard drinks of alcohol    Types: 4 Cans of beer per week   Drug use: No   Sexual activity: Not on file  Other Topics Concern   Not on file  Social History Narrative   Not on file   Social Determinants of Health    Financial Resource Strain: Not on file  Food Insecurity: Not on file  Transportation Needs: Not on file  Physical Activity: Not on file  Stress: Not on file  Social Connections: Not on file  Intimate Partner Violence: Not on file    Review of Systems: See HPI, otherwise negative ROS  Physical Exam: BP (!) 141/73   Pulse 77   Temp (!) 97 F (36.1 C) (Temporal)   Resp 12   Ht '5\' 2"'$  (1.575 m)   Wt 77.6 kg   SpO2 99%   BMI 31.28 kg/m  General:   Alert, cooperative in NAD Head:  Normocephalic and atraumatic. Respiratory:  Normal work of breathing. Cardiovascular:  RRR  Impression/Plan: Gloria Gaines is here for cataract surgery.  Risks, benefits, limitations, and alternatives regarding cataract surgery have been reviewed with the patient.  Questions have been answered.  All parties agreeable.   Benay Pillow, MD  03/17/2022, 12:25 PM

## 2022-03-17 NOTE — Op Note (Signed)
OPERATIVE NOTE  Gloria Gaines [id number] 03/17/2022   PREOPERATIVE DIAGNOSIS:  Nuclear sclerotic cataract right eye.  H25.11   POSTOPERATIVE DIAGNOSIS:    Nuclear sclerotic cataract right eye.     PROCEDURE:  Phacoemusification with posterior chamber intraocular lens placement of the right eye   LENS:   Implant Name Type Inv. Item Serial No. Manufacturer Lot No. LRB No. Used Action  LENS IOL EYHANCE TORIC II 8.5 - [id number]  LENS IOL Tyler Deis II 8.5 [id number] [employer]  Right 1 Implanted       Procedure(s): CATARACT EXTRACTION PHACO AND INTRAOCULAR LENS PLACEMENT (IOC) RIGHT eyhance TORIC  2.95  00:27.4 (Right)  [id number] +8.5 '@072'$    ULTRASOUND TIME: 0 minutes 27 seconds.  CDE 2.95   SURGEON:  Benay Pillow, MD, MPH  ANESTHESIOLOGIST: Anesthesiologist: Molli Barrows, MD CRNA: Jacqualin Combes, CRNA   ANESTHESIA:  Topical with tetracaine drops augmented with 1% preservative-free intracameral lidocaine.  ESTIMATED BLOOD LOSS: less than 1 mL.   COMPLICATIONS:  None.   DESCRIPTION OF PROCEDURE:  The patient was identified in the holding room and transported to the operating room and placed in the supine position under the operating microscope.  The right eye was identified as the operative eye and it was prepped and draped in the usual sterile ophthalmic fashion.  The verion system was registered without difficulty.   A 1.0 millimeter clear-corneal paracentesis was made at the 10:30 position. 0.5 ml of preservative-free 1% lidocaine with epinephrine was injected into the anterior chamber.  The anterior chamber was filled with viscoelastic.  A 2.4 millimeter keratome was used to make a near-clear corneal incision at the 8:00 position.  A curvilinear capsulorrhexis was made with a cystotome and capsulorrhexis forceps.  Balanced salt solution was used to hydrodissect and hydrodelineate the nucleus.   Phacoemulsification was then used in stop and chop fashion to remove  the lens nucleus and epinucleus.  The remaining cortex was then removed using the irrigation and aspiration handpiece. Viscoelastic was then placed into the capsular bag to distend it for lens placement.  A lens was then injected into the capsular bag.  The remaining viscoelastic was aspirated.  The lens was rotated with guidance from the Darke system.   Wounds were hydrated with balanced salt solution.  The anterior chamber was inflated to a physiologic pressure with balanced salt solution. The lens was well centered.  Intracameral vigamox 0.1 mL undiluted was injected into the eye and a drop placed onto the ocular surface.  No wound leaks were noted.  The patient was taken to the recovery room in stable condition without complications of anesthesia or surgery  Benay Pillow 03/17/2022, 12:50 PM

## 2022-03-17 NOTE — Transfer of Care (Signed)
Immediate Anesthesia Transfer of Care Note  Patient: Gloria Gaines [facility]  Procedure(s) Performed: CATARACT EXTRACTION PHACO AND INTRAOCULAR LENS PLACEMENT (IOC) RIGHT eyhance TORIC  2.95  00:27.4 (Right: Eye)  Patient Location: PACU  Anesthesia Type: MAC  Level of Consciousness: awake, alert  and patient cooperative  Airway and Oxygen Therapy: Patient Spontanous Breathing and Patient connected to supplemental oxygen  Post-op Assessment: Post-op Vital signs reviewed, Patient's Cardiovascular Status Stable, Respiratory Function Stable, Patent Airway and No signs of Nausea or vomiting  Post-op Vital Signs: Reviewed and stable  Complications: No notable events documented.

## 2022-03-17 NOTE — Anesthesia Preprocedure Evaluation (Signed)
Anesthesia Evaluation  Patient identified by MRN, date of birth, ID band Patient awake    Reviewed: Allergy & Precautions, H&P , NPO status , Patient's Chart, lab work & pertinent test results, reviewed documented beta blocker date and time   History of Anesthesia Complications (+) PONV and history of anesthetic complications  Airway Mallampati: II  TM Distance: >3 FB Neck ROM: full    Dental no notable dental hx. (+) Teeth Intact   Pulmonary neg pulmonary ROS, former smoker   Pulmonary exam normal breath sounds clear to auscultation       Cardiovascular Exercise Tolerance: Good hypertension, On Medications negative cardio ROS  Rhythm:regular Rate:Normal     Neuro/Psych negative neurological ROS  negative psych ROS   GI/Hepatic Neg liver ROS,GERD  Medicated,,  Endo/Other  negative endocrine ROSdiabetes, Well Controlled    Renal/GU      Musculoskeletal   Abdominal   Peds  Hematology negative hematology ROS (+)   Anesthesia Other Findings   Reproductive/Obstetrics negative OB ROS                             Anesthesia Physical Anesthesia Plan  ASA: 2  Anesthesia Plan: MAC   Post-op Pain Management:    Induction:   PONV Risk Score and Plan:   Airway Management Planned:   Additional Equipment:   Intra-op Plan:   Post-operative Plan:   Informed Consent: I have reviewed the patients History and Physical, chart, labs and discussed the procedure including the risks, benefits and alternatives for the proposed anesthesia with the patient or authorized representative who has indicated his/her understanding and acceptance.       Plan Discussed with: CRNA  Anesthesia Plan Comments:        Anesthesia Quick Evaluation

## 2022-03-18 ENCOUNTER — Encounter: Payer: Self-pay | Admitting: Ophthalmology

## 2022-03-18 ENCOUNTER — Other Ambulatory Visit: Payer: Self-pay

## 2022-03-28 NOTE — Discharge Instructions (Signed)
   Cataract Surgery, Care After ? ?This sheet gives you information about how to care for yourself after your surgery.  Your ophthalmologist may also give you more specific instructions.  If you have problems or questions, contact your doctor at [facility], [phone]. ? ?What can I expect after the surgery? ?It is common to have: ?Itching ?Foreign body sensation (feels like a grain of sand in the eye) ?Watery discharge (excess tearing) ?Sensitivity to light and touch ?Bruising in or around the eye ?Mild blurred vision ? ?Follow these instructions at home: ?Do not touch or rub your eyes. ?You may be told to wear a protective shield or sunglasses to protect your eyes. ?Do not put a contact lens in the operative eye unless your doctor approves. ?Keep the lids and face clean and dry. ?Do not allow water to hit you directly in the face while showering. ?Keep soap and shampoo out of your eyes. ?Do not use eye makeup for 1 week. ? ?Check your eye every day for signs of infection.  Watch for: ?Redness, swelling, or pain. ?Fluid, blood or pus. ?Worsening vision. ?Worsening sensitivity to light or touch. ? ?Activity: ?During the first day, avoid bending over and reading.  You may resume reading and bending the next day. ?Do not drive or use heavy machinery for at least 24 hours. ?Avoid strenuous activities for 1 week.  Activities such as walking, treadmill, exercise bike, and climbing stairs are okay. ?Do not lift heavy (>20 pound) objects for 1 week. ?Do not do yardwork, gardening, or dirty housework (mopping, cleaning bathrooms, vacuuming, etc.) for 1 week. ?Do not swim or use a hot tub for 2 weeks. ?Ask your doctor when you can return to work. ? ?General Instructions: ?Take or apply prescription and over-the-counter medicines as directed by your doctor, including eyedrops and ointments. ?Resume medications discontinued prior to surgery, unless told otherwise by your doctor. ?Keep all follow up appointments as  scheduled. ? ?Contact a health care provider if: ?You have increased bruising around your eye. ?You have pain that is not helped with medication. ?You have a fever. ?You have fluid, pus, or blood coming from your eye or incision. ?Your sensitivity to light gets worse. ?You have spots (floaters) of flashing lights in your vision. ?You have nausea or vomiting. ? ?Go to the nearest emergency room or call 911 if: ?You have sudden loss of vision. ?You have severe, worsening eye pain. ? ?

## 2022-03-31 ENCOUNTER — Encounter: Payer: Self-pay | Admitting: Ophthalmology

## 2022-03-31 ENCOUNTER — Ambulatory Visit: Payer: Medicare Other | Admitting: Anesthesiology

## 2022-03-31 ENCOUNTER — Encounter
Admission: RE | Disposition: A | Discharge status: Home / Self Care | Payer: Self-pay | Source: Home / Self Care | Attending: Ophthalmology

## 2022-03-31 ENCOUNTER — Ambulatory Visit
Admission: RE | Admit: 2022-03-31 | Discharge: 2022-03-31 | Disposition: A | Discharge status: Home / Self Care | Payer: Medicare Other | Attending: Ophthalmology | Admitting: Ophthalmology

## 2022-03-31 ENCOUNTER — Other Ambulatory Visit: Payer: Self-pay

## 2022-03-31 DIAGNOSIS — E119 Type 2 diabetes mellitus without complications: Secondary | ICD-10-CM

## 2022-03-31 DIAGNOSIS — E669 Obesity, unspecified: Secondary | ICD-10-CM | POA: Insufficient documentation

## 2022-03-31 DIAGNOSIS — Z6831 Body mass index (BMI) 31.0-31.9, adult: Secondary | ICD-10-CM | POA: Insufficient documentation

## 2022-03-31 DIAGNOSIS — H2512 Age-related nuclear cataract, left eye: Secondary | ICD-10-CM | POA: Insufficient documentation

## 2022-03-31 DIAGNOSIS — Z87891 Personal history of nicotine dependence: Secondary | ICD-10-CM | POA: Insufficient documentation

## 2022-03-31 DIAGNOSIS — K219 Gastro-esophageal reflux disease without esophagitis: Secondary | ICD-10-CM | POA: Insufficient documentation

## 2022-03-31 HISTORY — PX: CATARACT EXTRACTION W/PHACO: SHX586

## 2022-03-31 SURGERY — PHACOEMULSIFICATION, CATARACT, WITH IOL INSERTION
Anesthesia: Monitor Anesthesia Care | Site: Eye | Laterality: Left

## 2022-03-31 MED ORDER — LACTATED RINGERS IV SOLN: INTRAVENOUS | Status: DC

## 2022-03-31 MED ORDER — MOXIFLOXACIN HCL 0.5 % OP SOLN
OPHTHALMIC | Status: DC | PRN
  Administered 2022-03-31: .2 mL via OPHTHALMIC

## 2022-03-31 MED ORDER — SCOPOLAMINE 1 MG/3DAYS TD PT72
1.0000 | MEDICATED_PATCH | Freq: Once | TRANSDERMAL | Status: DC
  Administered 2022-03-31: 1.5 mg via TRANSDERMAL

## 2022-03-31 MED ORDER — SIGHTPATH DOSE#1 NA HYALUR & NA CHOND-NA HYALUR IO KIT
PACK | INTRAOCULAR | Status: DC | PRN
  Administered 2022-03-31: 1 via OPHTHALMIC

## 2022-03-31 MED ORDER — ARMC OPHTHALMIC DILATING DROPS
1.0000 | OPHTHALMIC | Status: DC | PRN
  Administered 2022-03-31 (×3): 1 via OPHTHALMIC

## 2022-03-31 MED ORDER — BRIMONIDINE TARTRATE-TIMOLOL 0.2-0.5 % OP SOLN
OPHTHALMIC | Status: DC | PRN
  Administered 2022-03-31: 1 [drp] via OPHTHALMIC

## 2022-03-31 MED ORDER — ACETAMINOPHEN 325 MG PO TABS: 650.0000 mg | ORAL_TABLET | Freq: Once | ORAL | Status: DC | PRN

## 2022-03-31 MED ORDER — ONDANSETRON HCL 4 MG/2ML IJ SOLN
INTRAMUSCULAR | Status: DC | PRN
  Administered 2022-03-31: 4 mg via INTRAVENOUS

## 2022-03-31 MED ORDER — MIDAZOLAM HCL 2 MG/2ML IJ SOLN
INTRAMUSCULAR | Status: DC | PRN
  Administered 2022-03-31: 2 mg via INTRAVENOUS

## 2022-03-31 MED ORDER — TETRACAINE HCL 0.5 % OP SOLN
1.0000 [drp] | OPHTHALMIC | Status: DC | PRN
  Administered 2022-03-31 (×3): 1 [drp] via OPHTHALMIC

## 2022-03-31 MED ORDER — LIDOCAINE HCL (PF) 2 % IJ SOLN
INTRAOCULAR | Status: DC | PRN
  Administered 2022-03-31: 1 mL via INTRAOCULAR

## 2022-03-31 MED ORDER — SODIUM CHLORIDE 0.9% FLUSH
INTRAVENOUS | Status: DC | PRN
  Administered 2022-03-31: 10 mL via INTRAVENOUS

## 2022-03-31 MED ORDER — SIGHTPATH DOSE#1 BSS IO SOLN
INTRAOCULAR | Status: DC | PRN
  Administered 2022-03-31: 153 mL via OPHTHALMIC

## 2022-03-31 MED ORDER — SIGHTPATH DOSE#1 BSS IO SOLN
INTRAOCULAR | Status: DC | PRN
  Administered 2022-03-31: 15 mL

## 2022-03-31 MED ORDER — ACETAMINOPHEN 160 MG/5ML PO SOLN: 325.0000 mg | ORAL | Status: DC | PRN

## 2022-03-31 MED ORDER — ONDANSETRON HCL 4 MG/2ML IJ SOLN: 4.0000 mg | Freq: Once | INTRAMUSCULAR | Status: DC | PRN

## 2022-03-31 SURGICAL SUPPLY — 22 items
CANNULA ANT/CHMB 27G (MISCELLANEOUS) IMPLANT
CANNULA ANT/CHMB 27GA (MISCELLANEOUS) IMPLANT
CATARACT SUITE SIGHTPATH (MISCELLANEOUS) ×1 IMPLANT
DISSECTOR HYDRO NUCLEUS 50X22 (MISCELLANEOUS) ×1 IMPLANT
FEE CATARACT SUITE SIGHTPATH (MISCELLANEOUS) ×1 IMPLANT
GLOVE SURG GAMMEX PI TX LF 7.5 (GLOVE) ×1 IMPLANT
GLOVE SURG SYN 8.5  E (GLOVE) ×1
GLOVE SURG SYN 8.5 E (GLOVE) ×1 IMPLANT
GLOVE SURG SYN 8.5 PF PI (GLOVE) ×1 IMPLANT
LENS IOL EYHANCE TORIC II 12 ×1 IMPLANT
LENS IOL EYHANCE TRC 300 12.0 IMPLANT
LENS TECNIS  EYHANCE TORIC II ×1 IMPLANT
LENS TECNIS EYHANCE TORIC II ×1 IMPLANT
NDL FILTER BLUNT 18X1 1/2 (NEEDLE) ×1 IMPLANT
NEEDLE FILTER BLUNT 18X1 1/2 (NEEDLE) ×1 IMPLANT
PACK VIT ANT 23G (MISCELLANEOUS) IMPLANT
RING MALYGIN (MISCELLANEOUS) IMPLANT
SUT ETHILON 10-0 CS-B-6CS-B-6 (SUTURE)
SUTURE EHLN 10-0 CS-B-6CS-B-6 (SUTURE) IMPLANT
SYR 3ML LL SCALE MARK (SYRINGE) ×1 IMPLANT
SYR 5ML LL (SYRINGE) ×1 IMPLANT
WATER STERILE IRR 250ML POUR (IV SOLUTION) ×1 IMPLANT

## 2022-03-31 NOTE — Transfer of Care (Signed)
Immediate Anesthesia Transfer of Care Note  Patient: Gloria Gaines [facility]  Procedure(s) Performed: CATARACT EXTRACTION PHACO AND INTRAOCULAR LENS PLACEMENT (IOC) LEFT EYHANCE TORIC  2.55  00:28.0 (Left: Eye)  Patient Location: PACU  Anesthesia Type: MAC  Level of Consciousness: awake, alert  and patient cooperative  Airway and Oxygen Therapy: Patient Spontanous Breathing and Patient connected to supplemental oxygen  Post-op Assessment: Post-op Vital signs reviewed, Patient's Cardiovascular Status Stable, Respiratory Function Stable, Patent Airway and No signs of Nausea or vomiting  Post-op Vital Signs: Reviewed and stable  Complications: No notable events documented.

## 2022-03-31 NOTE — Op Note (Signed)
OPERATIVE NOTE  Gloria Gaines [id number] 03/31/2022   PREOPERATIVE DIAGNOSIS:  Nuclear sclerotic cataract left eye.  H25.12   POSTOPERATIVE DIAGNOSIS:    Nuclear sclerotic cataract left eye.     PROCEDURE:  Phacoemusification with posterior chamber intraocular lens placement of the left eye   LENS:   Implant Name Type Inv. Item Serial No. Manufacturer Lot No. LRB No. Used Action  LENS TECNIS  Tyler Deis II - [id number]  LENS TECNIS  Tyler Deis II [id number] SIGHT[employer]  Left 1 Implanted      Procedure(s): CATARACT EXTRACTION PHACO AND INTRAOCULAR LENS PLACEMENT (IOC) LEFT EYHANCE TORIC  2.55  00:28.0 (Left)  [id number] +12.0   ULTRASOUND TIME: 0 minutes 28 seconds.  CDE 2.55   SURGEON:  Benay Pillow, MD, MPH   ANESTHESIA:  Topical with tetracaine drops augmented with 1% preservative-free intracameral lidocaine.  ESTIMATED BLOOD LOSS: <1 mL   COMPLICATIONS:  None.   DESCRIPTION OF PROCEDURE:  The patient was identified in the holding room and transported to the operating room and placed in the supine position under the operating microscope.  The left eye was identified as the operative eye and it was prepped and draped in the usual sterile ophthalmic fashion.  The verion system was registered without difficulty.   A 1.0 millimeter clear-corneal paracentesis was made at the 5:00 position. 0.5 ml of preservative-free 1% lidocaine with epinephrine was injected into the anterior chamber.  The anterior chamber was filled with viscoelastic.  A 2.4 millimeter keratome was used to make a near-clear corneal incision at the 2:00 position.  A curvilinear capsulorrhexis was made with a cystotome and capsulorrhexis forceps.  Balanced salt solution was used to hydrodissect and hydrodelineate the nucleus.   Phacoemulsification was then used in stop and chop fashion to remove the lens nucleus and epinucleus.  The remaining cortex was then removed using the irrigation and aspiration  handpiece. Viscoelastic was then placed into the capsular bag to distend it for lens placement.  A lens was then injected into the capsular bag.  The remaining viscoelastic was aspirated.  The lens was rotated with guidance from the [place] system.   Wounds were hydrated with balanced salt solution.  The anterior chamber was inflated to a physiologic pressure with balanced salt solution.  Intracameral vigamox 0.1 mL undiltued was injected into the eye and a drop placed onto the ocular surface.  No wound leaks were noted.  The patient was taken to the recovery room in stable condition without complications of anesthesia or surgery  Benay Pillow 03/31/2022, 11:02 AM

## 2022-03-31 NOTE — Anesthesia Preprocedure Evaluation (Addendum)
Anesthesia Evaluation  Patient identified by MRN, date of birth, ID band Patient awake    Reviewed: Allergy & Precautions, NPO status , Patient's Chart, lab work & pertinent test results  History of Anesthesia Complications (+) PONV and history of anesthetic complications  Airway Mallampati: III   Neck ROM: Full    Dental   Crowns :   Pulmonary former smoker (quit 1987)   Pulmonary exam normal breath sounds clear to auscultation       Cardiovascular Exercise Tolerance: Good negative cardio ROS Normal cardiovascular exam Rhythm:Regular Rate:Normal     Neuro/Psych negative neurological ROS     GI/Hepatic ,GERD  ,,  Endo/Other  Obesity   Renal/GU negative Renal ROS     Musculoskeletal  (+) Arthritis ,    Abdominal   Peds  Hematology negative hematology ROS (+)   Anesthesia Other Findings   Reproductive/Obstetrics                             Anesthesia Physical Anesthesia Plan  ASA: 2  Anesthesia Plan: MAC   Post-op Pain Management:    Induction: Intravenous  PONV Risk Score and Plan: 3 and Treatment may vary due to age or medical condition, Midazolam and TIVA  Airway Management Planned: Natural Airway and Nasal Cannula  Additional Equipment:   Intra-op Plan:   Post-operative Plan:   Informed Consent: I have reviewed the patients History and Physical, chart, labs and discussed the procedure including the risks, benefits and alternatives for the proposed anesthesia with the patient or authorized representative who has indicated his/her understanding and acceptance.     Dental advisory given  Plan Discussed with: CRNA  Anesthesia Plan Comments: (LMA/GETA backup discussed.  Patient consented for risks of anesthesia including but not limited to:  - adverse reactions to medications - damage to eyes, teeth, lips or other oral mucosa - nerve damage due to positioning  -  sore throat or hoarseness - damage to heart, brain, nerves, lungs, other parts of body or loss of life  Informed patient about role of CRNA in peri- and intra-operative care.  Patient voiced understanding.)        Anesthesia Quick Evaluation

## 2022-03-31 NOTE — H&P (Signed)
[facility]   Primary Care Physician:  Gayland Curry, MD Ophthalmologist: Dr. Benay Pillow  Pre-Procedure History & Physical: HPI:  Gloria Gaines is a 72 y.o. female here for cataract surgery.   Past Medical History:  Diagnosis Date   Arthritis    Cancer ([place])    skin   GERD (gastroesophageal reflux disease)    Hx of hand surgery    left hand   PONV (postoperative nausea and vomiting)     Past Surgical History:  Procedure Laterality Date   CATARACT EXTRACTION W/PHACO Right 03/17/2022   Procedure: CATARACT EXTRACTION PHACO AND INTRAOCULAR LENS PLACEMENT ([place]) RIGHT eyhance TORIC  2.95  00:27.4;  Surgeon: Eulogio Bear, MD;  Location: [place];  Service: Ophthalmology;  Laterality: Right;   ESOPHAGOGASTRODUODENOSCOPY (EGD) WITH PROPOFOL N/A 08/13/2018   Procedure: ESOPHAGOGASTRODUODENOSCOPY (EGD) WITH PROPOFOL;  Surgeon: Lollie Sails, MD;  Location: [facility] ENDOSCOPY;  Service: Endoscopy;  Laterality: N/A;   HAND SURGERY  1998   TONSILLECTOMY      Prior to Admission medications   Medication Sig Start Date End Date Taking? Authorizing Provider  atorvastatin (LIPITOR) 10 MG tablet Take 10 mg by mouth daily.   Yes [provider]  Multiple Vitamin (MULTIVITAMIN) tablet Take 1 tablet by mouth daily.   Yes [provider]    Allergies as of 01/16/2022   (No Known Allergies)    Family History  Problem Relation Age of Onset   Breast cancer Neg Hx     Social History   Socioeconomic History   Marital status: Married    Spouse name: Not on file   Number of children: Not on file   Years of education: Not on file   Highest education level: Not on file  Occupational History   Not on file  Tobacco Use   Smoking status: Former    Types: Cigarettes    Quit date: 46    Years since quitting: 37.0   Smokeless tobacco: Never   Tobacco comments:    socially  Media planner   Vaping Use: Never used  Substance and Sexual  Activity   Alcohol use: Yes    Alcohol/week: 4.0 standard drinks of alcohol    Types: 4 Cans of beer per week   Drug use: No   Sexual activity: Not on file  Other Topics Concern   Not on file  Social History Narrative   Not on file   Social Determinants of Health   Financial Resource Strain: Not on file  Food Insecurity: Not on file  Transportation Needs: Not on file  Physical Activity: Not on file  Stress: Not on file  Social Connections: Not on file  Intimate Partner Violence: Not on file    Review of Systems: See HPI, otherwise negative ROS  Physical Exam: BP 139/84   Pulse 62   Temp (!) 97 F (36.1 C) (Temporal)   Wt 78 kg   SpO2 100%   BMI 31.44 kg/m  General:   Alert, cooperative in NAD Head:  Normocephalic and atraumatic. Respiratory:  Normal work of breathing. Cardiovascular:  RRR  Impression/Plan: Gloria Gaines is here for cataract surgery.  Risks, benefits, limitations, and alternatives regarding cataract surgery have been reviewed with the patient.  Questions have been answered.  All parties agreeable.   Benay Pillow, MD  03/31/2022, 10:29 AM

## 2022-03-31 NOTE — Anesthesia Postprocedure Evaluation (Signed)
Anesthesia Post Note  Patient: Gloria Gaines [facility]  Procedure(s) Performed: CATARACT EXTRACTION PHACO AND INTRAOCULAR LENS PLACEMENT (IOC) LEFT EYHANCE TORIC  2.55  00:28.0 (Left: Eye)  Patient location during evaluation: PACU Anesthesia Type: MAC Level of consciousness: awake and alert, oriented and patient cooperative Pain management: pain level controlled Vital Signs Assessment: post-procedure vital signs reviewed and stable Respiratory status: spontaneous breathing, nonlabored ventilation and respiratory function stable Cardiovascular status: blood pressure returned to baseline and stable Postop Assessment: adequate PO intake Anesthetic complications: no   No notable events documented.   Last Vitals:  Vitals:   03/31/22 1104 03/31/22 1109  BP: (!) 140/69 (!) 145/81  Pulse: 70 65  Resp: 14 14  Temp: 36.5 C 36.4 C  SpO2: 100% 99%    Last Pain:  Vitals:   03/31/22 1109  TempSrc:   PainSc: 0-No pain                 Darrin Nipper

## 2022-04-03 ENCOUNTER — Encounter: Payer: Self-pay | Admitting: Ophthalmology
